# Patient Record
Sex: Male | Born: 2000 | Race: White | Hispanic: No | Marital: Single | Smoking: Current every day smoker
Health system: Southern US, Community
[De-identification: ages and names within clinical notes are randomized; demographics above are authoritative.]

---
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
## 2018-05-15 IMAGING — CR DG CHEST 1V PORT
1 series · 1 of 1 positions shown · non-contrast
Comparison: CT scan of the chest of yesterday.

CLINICAL DATA: Persistent 10% or less left apical pneumothorax.
Left basilar atelectasis or contusion with small left pleural
effusion.

EXAM:
PORTABLE CHEST 1 VIEW

[AP]
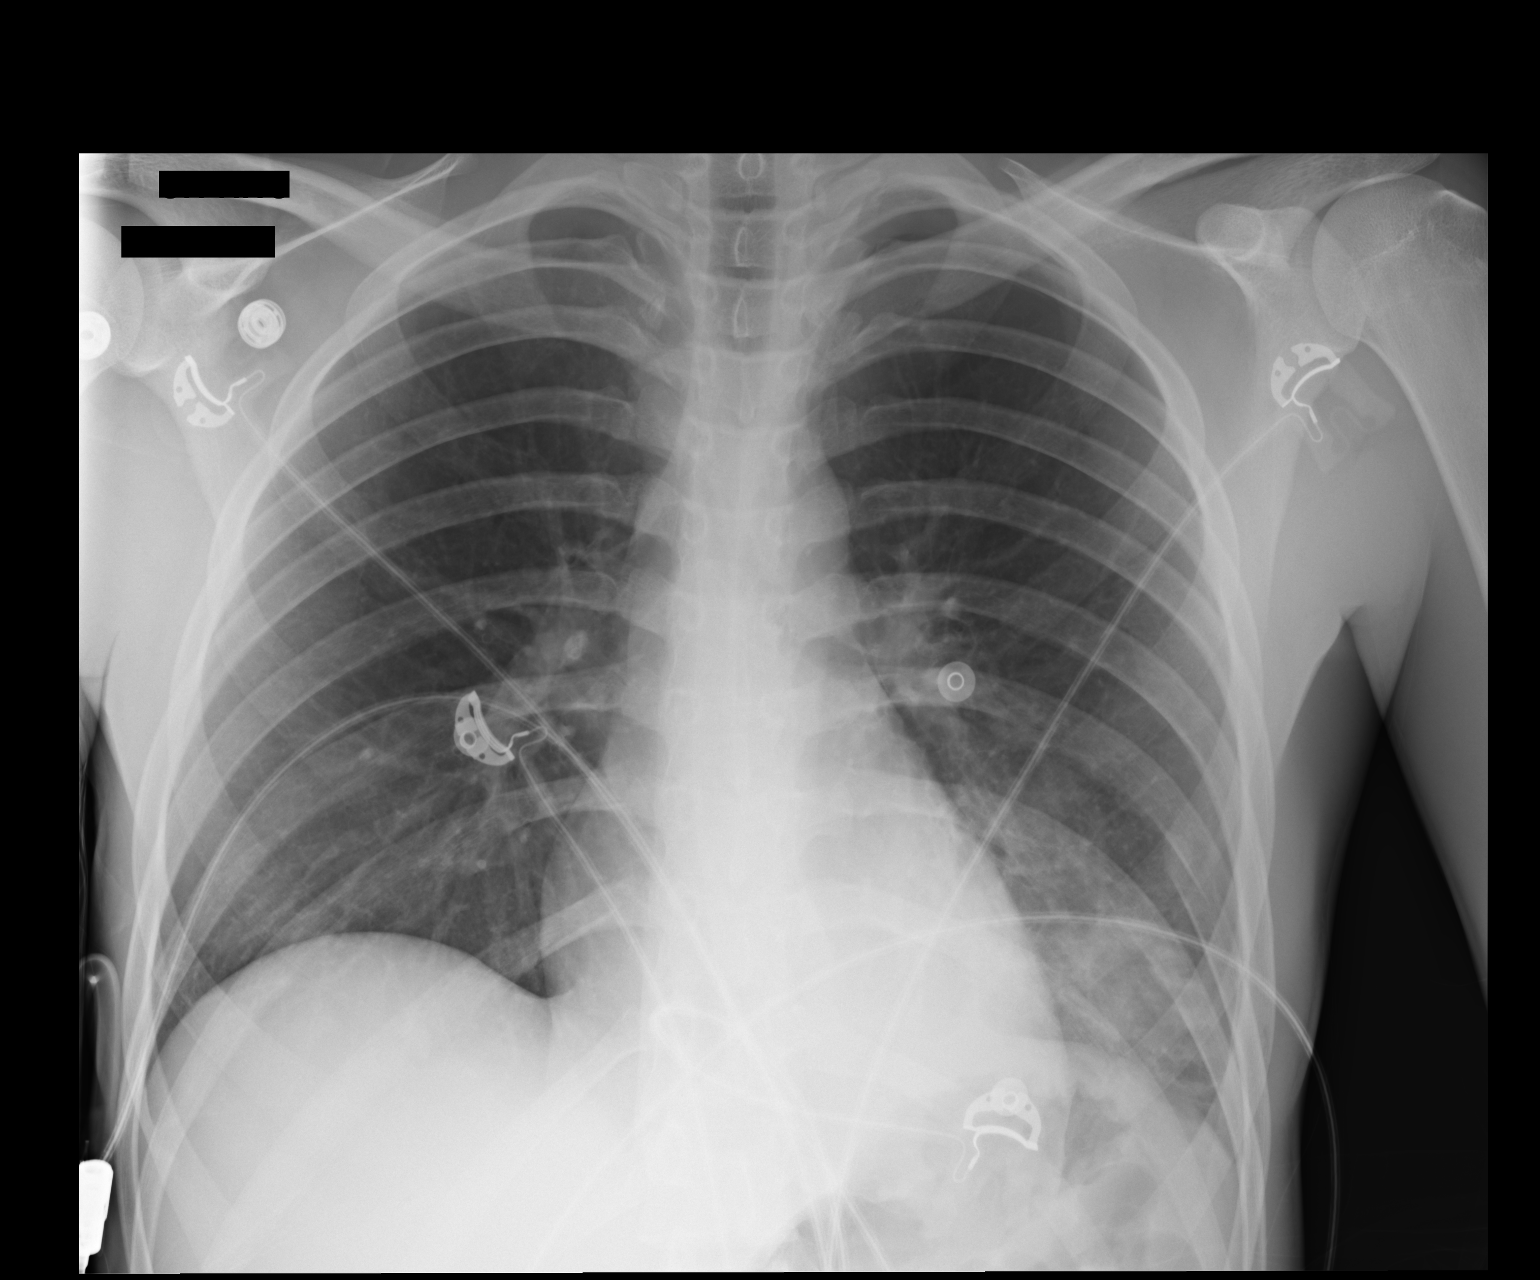

[1 of 1 positions shown; findings below may reference images not displayed]

FINDINGS: There is a persistent approximately 10% left apical pneumothorax.
There is persistently increased density at the left lung base
consistent with atelectasis or pulmonary contusion. A small left
pleural effusion is suspected. The right lung is clear. The heart
and pulmonary vascularity are normal. The mediastinum is normal in
width. There is no mediastinal shift. The known displaced left
lateral eighth rib fracture is again demonstrated.
IMPRESSION: 10% or less left apical pneumothorax. Small left pleural effusion
and left lower lobe atelectasis or contusion. No mediastinal shift.
Known displaced left lateral eighth rib fracture.

## 2019-06-14 ENCOUNTER — Other Ambulatory Visit: Payer: Self-pay

## 2019-06-14 ENCOUNTER — Emergency Department
Admission: EM | Admit: 2019-06-14 | Discharge: 2019-06-14 | Disposition: A | Discharge status: Home / Self Care | Payer: Self-pay | Attending: Emergency Medicine | Admitting: Emergency Medicine

## 2019-06-14 DIAGNOSIS — F17210 Nicotine dependence, cigarettes, uncomplicated: Secondary | ICD-10-CM | POA: Insufficient documentation

## 2019-06-14 DIAGNOSIS — R195 Other fecal abnormalities: Secondary | ICD-10-CM | POA: Insufficient documentation

## 2019-06-14 MED ORDER — PRAZIQUANTEL 600 MG PO TABS: 600.0000 mg | ORAL_TABLET | Freq: Once | ORAL | 0 refills | Status: DC

## 2019-06-14 MED ORDER — PRAZIQUANTEL 600 MG PO TABS: 600.0000 mg | ORAL_TABLET | Freq: Once | ORAL | 0 refills | Status: AC
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
## 2019-06-14 NOTE — ED Notes (Signed)
See triage note; pt has photo of what he believes could be a tapeworm. Pt states his BM this morning was "a little" loose, nothing too far out of ordinary. NAD noted.

## 2019-06-14 NOTE — ED Notes (Signed)
Pt discharged home after verbalizing understanding of discharge instructions; nad noted. 

## 2019-06-14 NOTE — ED Triage Notes (Signed)
Pt states that he got out of jail yesterday and states this am when he bowl movement he saw what appeared to be a tapeworm

## 2021-06-20 ENCOUNTER — Emergency Department
Admission: EM | Admit: 2021-06-20 | Discharge: 2021-06-20 | Disposition: A | Discharge status: Home / Self Care | Payer: Self-pay | Attending: Emergency Medicine | Admitting: Emergency Medicine

## 2021-06-20 ENCOUNTER — Emergency Department: Payer: Self-pay

## 2021-06-20 DIAGNOSIS — F1910 Other psychoactive substance abuse, uncomplicated: Secondary | ICD-10-CM | POA: Insufficient documentation

## 2021-06-20 DIAGNOSIS — R1031 Right lower quadrant pain: Secondary | ICD-10-CM | POA: Insufficient documentation

## 2021-06-20 DIAGNOSIS — R110 Nausea: Secondary | ICD-10-CM | POA: Insufficient documentation

## 2021-06-20 DIAGNOSIS — R000 Tachycardia, unspecified: Secondary | ICD-10-CM | POA: Insufficient documentation

## 2021-06-20 LAB — CBC
HCT: 46.6 % (ref 39.0–52.0)
Hemoglobin: 15.7 g/dL (ref 13.0–17.0)
MCH: 29 pg (ref 26.0–34.0)
MCHC: 33.7 g/dL (ref 30.0–36.0)
MCV: 86 fL (ref 80.0–100.0)
Platelets: 329 10*3/uL (ref 150–400)
RBC: 5.42 MIL/uL (ref 4.22–5.81)
RDW: 13.2 % (ref 11.5–15.5)
WBC: 10 10*3/uL (ref 4.0–10.5)
nRBC: 0 % (ref 0.0–0.2)

## 2021-06-20 LAB — COMPREHENSIVE METABOLIC PANEL
ALT: 36 U/L (ref 0–44)
AST: 21 U/L (ref 15–41)
Albumin: 4.9 g/dL (ref 3.5–5.0)
Alkaline Phosphatase: 59 U/L (ref 38–126)
Anion gap: 7 (ref 5–15)
BUN: 23 mg/dL — ABNORMAL HIGH (ref 6–20)
CO2: 26 mmol/L (ref 22–32)
Calcium: 9.6 mg/dL (ref 8.9–10.3)
Chloride: 103 mmol/L (ref 98–111)
Creatinine, Ser: 0.87 mg/dL (ref 0.61–1.24)
GFR, Estimated: >60 mL/min (ref >60)
Glucose, Bld: 93 mg/dL (ref 70–99)
Potassium: 4 mmol/L (ref 3.5–5.1)
Sodium: 136 mmol/L (ref 135–145)
Total Bilirubin: 1.4 mg/dL — ABNORMAL HIGH (ref 0.3–1.2)
Total Protein: 8.1 g/dL (ref 6.5–8.1)

## 2021-06-20 LAB — URINALYSIS, ROUTINE W REFLEX MICROSCOPIC
Bilirubin Urine: NEGATIVE
Glucose, UA: NEGATIVE mg/dL
Hgb urine dipstick: NEGATIVE
Ketones, ur: 80 mg/dL — AB
Leukocytes,Ua: NEGATIVE
Nitrite: NEGATIVE
Protein, ur: NEGATIVE mg/dL
Specific Gravity, Urine: 1.035 — ABNORMAL HIGH (ref 1.005–1.030)
pH: 6 (ref 5.0–8.0)

## 2021-06-20 LAB — LIPASE, BLOOD: Lipase: 23 U/L (ref 11–51)

## 2021-06-20 MED ORDER — SODIUM CHLORIDE 0.9 % IV BOLUS
1000.0000 mL | Freq: Once | INTRAVENOUS | Status: AC
  Administered 2021-06-20: 1000 mL via INTRAVENOUS

## 2021-06-20 MED ORDER — IOHEXOL 350 MG/ML SOLN
100.0000 mL | Freq: Once | INTRAVENOUS | Status: AC | PRN
Start: 2021-06-20 — End: 2021-06-20
  Administered 2021-06-20: 100 mL via INTRAVENOUS

## 2021-06-20 MED ORDER — IOHEXOL 300 MG/ML  SOLN
100.0000 mL | Freq: Once | INTRAMUSCULAR | Status: DC | PRN
Start: 2021-06-20 — End: 2021-06-20

## 2021-06-20 NOTE — ED Notes (Signed)
Pt endorsing mid line lower abdominal pain that "feels weird and gurgled when I drink water"

## 2021-06-20 NOTE — ED Triage Notes (Signed)
Pt comes ems from home with right upper abd pain. EMS states non-pulsating mass in his right abd area. Denies n/v/d. States his stomach was rumbling this morning and he ate some [employer] and it started hurting. HR 114.

## 2021-06-20 NOTE — Discharge Instructions (Addendum)
Your work-up was reassuring you can take Tylenol 1 g every 8 hours to help with any pain and return to the ER if you develop worsening symptoms fevers or any other concerns  Is important that you try to stop using drugs as this can also cause abdominal discomfort

## 2021-06-20 NOTE — ED Provider Notes (Signed)
[facility] Provider Note    Event Date/Time   First MD Initiated Contact with Patient 06/20/21 1042     (approximate)   History   Abdominal Pain   HPI  Tim Sparks is a 21 y.o. male who comes in with abdominal pain.  Patient does report smoking meth yesterday but he reports that today he has developed some pain in his right lower abdomen contrary to triage note it is not his upper abdomen.  He denies any history of surgeries before.  Denies any vomiting or diarrhea.  Reports just having the pain.  Denies any other urinary symptoms or other concerns denies any daily alcohol use.  He denies any chest pain, shortness of breath or other concerns.   Physical Exam   Triage Vital Signs: ED Triage Vitals  Enc Vitals Group     BP 06/20/21 1035 (!) 165/148     Pulse Rate 06/20/21 1035 (!) 108     Resp 06/20/21 1035 18     Temp 06/20/21 1035 98.2 F (36.8 C)     Temp Source 06/20/21 1035 Oral     SpO2 06/20/21 1035 95 %     Weight --      Height --      Head Circumference --      Peak Flow --      Pain Score 06/20/21 1032 0     Pain Loc --      Pain Edu? --      Excl. in [place]? --     Most recent vital signs: Vitals:   06/20/21 1035  BP: (!) 165/148  Pulse: (!) 108  Resp: 18  Temp: 98.2 F (36.8 C)  SpO2: 95%     General: Awake, no distress.  CV:  Good peripheral perfusion.  Resp:  Normal effort.  Abd:  No distention. Tender in the RLQ  Other:     ED Results / Procedures / Treatments   Labs (all labs ordered are listed, but only abnormal results are displayed) Labs Reviewed  LIPASE, BLOOD  COMPREHENSIVE METABOLIC PANEL  CBC  URINALYSIS, ROUTINE W REFLEX MICROSCOPIC     EKG  My interpretation of EKG:  Sinus rhythm rate of 98 without any ST elevation or T wave inversions, normal intervals  RADIOLOGY I have reviewed the CT personally interpreted and do not see any appendicitis or kidney stone  PROCEDURES:  Critical Care  performed: No  .1-3 Lead EKG Interpretation  Performed by: Vanessa [place], MD Authorized by: Vanessa [place], MD     Interpretation: normal     ECG rate:  90   ECG rate assessment: normal     Rhythm: sinus rhythm     Ectopy: none     Conduction: normal   Comments:     Initially tachycardic but normal sinus after fluids    MEDICATIONS ORDERED IN ED: Medications  sodium chloride 0.9 % bolus 1,000 mL (1,000 mLs Intravenous New Bag/Given 06/20/21 1150)  sodium chloride 0.9 % bolus 1,000 mL (1,000 mLs Intravenous New Bag/Given 06/20/21 1150)  iohexol (OMNIPAQUE) 350 MG/ML injection 100 mL (100 mLs Intravenous Contrast Given 06/20/21 1130)     IMPRESSION / MDM / ASSESSMENT AND PLAN / ED COURSE  I reviewed the triage vital signs and the nursing notes.   Patient's presentation is most consistent with acute presentation with potential threat to life or bodily function.   Differential includes appendicitis, diverticulitis, kidney stone.  Given he is  tender in his right lower quadrant will get CT imaging.  Lower suspicion for gallstones given his tenderness is not as much in the right upper quadrant.  Patient was given some IV fluids for his tachycardia and IV Zofran to help with any nausea  Lipase is normal.  CMP shows slightly elevated T. bili but normal LFTs.  CBC is normal.  CT imaging is negative.  Patient requesting discharge home stating that his ride is on the way..  Urine is still pending.  Will discharge patient with using Tylenol, ibuprofen for pain and we discussed cessation of drugs  The patient is on the cardiac monitor to evaluate for evidence of arrhythmia and/or significant heart rate changes.      FINAL CLINICAL IMPRESSION(S) / ED DIAGNOSES   Final diagnoses:  Drug abuse ([place])  Right lower quadrant abdominal pain     Rx / DC Orders   ED Discharge Orders     None        Note:  This document was prepared using Dragon voice recognition software and may  include unintentional dictation errors.   Vanessa Hiller, MD 06/20/21 1226

## 2021-06-20 NOTE — ED Notes (Signed)
DC ppw provided, pt declines questions at this time. Pt alert and oriented to lobby on foot after providing verbal consent for DC.

## 2021-06-20 NOTE — ED Triage Notes (Signed)
Pt states he is also high on meth-last use 1am

## 2021-06-23 DIAGNOSIS — E876 Hypokalemia: Secondary | ICD-10-CM | POA: Insufficient documentation

## 2021-06-23 DIAGNOSIS — F1510 Other stimulant abuse, uncomplicated: Secondary | ICD-10-CM | POA: Insufficient documentation

## 2021-06-23 DIAGNOSIS — D72829 Elevated white blood cell count, unspecified: Secondary | ICD-10-CM | POA: Insufficient documentation

## 2021-06-23 DIAGNOSIS — Z20822 Contact with and (suspected) exposure to covid-19: Secondary | ICD-10-CM | POA: Insufficient documentation

## 2021-06-23 DIAGNOSIS — Y900 Blood alcohol level of less than 20 mg/100 ml: Secondary | ICD-10-CM | POA: Insufficient documentation

## 2021-06-23 LAB — COMPREHENSIVE METABOLIC PANEL
ALT: 20 U/L (ref 0–44)
AST: 17 U/L (ref 15–41)
Albumin: 4.7 g/dL (ref 3.5–5.0)
Alkaline Phosphatase: 55 U/L (ref 38–126)
Anion gap: 7 (ref 5–15)
BUN: 9 mg/dL (ref 6–20)
CO2: 27 mmol/L (ref 22–32)
Calcium: 9.4 mg/dL (ref 8.9–10.3)
Chloride: 105 mmol/L (ref 98–111)
Creatinine, Ser: 0.84 mg/dL (ref 0.61–1.24)
GFR, Estimated: >60 mL/min (ref >60)
Glucose, Bld: 95 mg/dL (ref 70–99)
Potassium: 3.2 mmol/L — ABNORMAL LOW (ref 3.5–5.1)
Sodium: 139 mmol/L (ref 135–145)
Total Bilirubin: 1.1 mg/dL (ref 0.3–1.2)
Total Protein: 7.8 g/dL (ref 6.5–8.1)

## 2021-06-23 LAB — CBC
HCT: 46.3 % (ref 39.0–52.0)
Hemoglobin: 15.5 g/dL (ref 13.0–17.0)
MCH: 28.6 pg (ref 26.0–34.0)
MCHC: 33.5 g/dL (ref 30.0–36.0)
MCV: 85.4 fL (ref 80.0–100.0)
Platelets: 348 10*3/uL (ref 150–400)
RBC: 5.42 MIL/uL (ref 4.22–5.81)
RDW: 12.9 % (ref 11.5–15.5)
WBC: 12.6 10*3/uL — ABNORMAL HIGH (ref 4.0–10.5)
nRBC: 0 % (ref 0.0–0.2)

## 2021-06-23 LAB — ETHANOL: Alcohol, Ethyl (B): <10 mg/dL (ref <10)

## 2021-06-23 LAB — ACETAMINOPHEN LEVEL: Acetaminophen (Tylenol), Serum: <10 ug/mL — ABNORMAL LOW (ref 10–30)

## 2021-06-23 LAB — SALICYLATE LEVEL: Salicylate Lvl: <7 mg/dL — ABNORMAL LOW (ref 7.0–30.0)

## 2021-06-23 NOTE — ED Triage Notes (Signed)
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 Notes using earlier today and he smokes meth. Denies SI/HI.

## 2021-06-24 ENCOUNTER — Emergency Department
Admission: EM | Admit: 2021-06-24 | Discharge: 2021-06-24 | Disposition: A | Discharge status: Home / Self Care | Payer: Self-pay | Attending: Emergency Medicine | Admitting: Emergency Medicine

## 2021-06-24 DIAGNOSIS — F1510 Other stimulant abuse, uncomplicated: Secondary | ICD-10-CM

## 2021-06-24 LAB — RESP PANEL BY RT-PCR (FLU A&B, COVID) ARPGX2
Influenza A by PCR: NEGATIVE
Influenza B by PCR: NEGATIVE
SARS Coronavirus 2 by RT PCR: NEGATIVE

## 2021-06-24 MED ORDER — POTASSIUM CHLORIDE CRYS ER 20 MEQ PO TBCR
40.0000 meq | EXTENDED_RELEASE_TABLET | Freq: Once | ORAL | Status: AC
  Administered 2021-06-24: 40 meq via ORAL
  Filled 2021-06-24: qty 2

## 2021-06-24 NOTE — BH Assessment (Signed)
Comprehensive Clinical Assessment (CCA) Note  06/24/2021 Tim Sparks [id number]  Chief Complaint: Patient is a 21 year old male presenting to [facility] ED voluntarily requesting detox treatment. Per triage note Pt presents via POV for psych eval and detox from meth. Notes using earlier today and he smokes meth. Denies SI/HI. During assessment patient appears alert and oriented x4, calm and cooperative. Patient reports using Methamphetamines via smoking. Patient reports smoking "1 gram" daily and reports that he first started using "2 years ago." Patient denies every being admitted to detox before in the past, he's unemployed and currently living with family. 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 Self  Referral name: No data recorded Referral phone number: No data recorded  Whom do you see for routine medical problems? No data recorded Practice/Facility Name: No data recorded Practice/Facility Phone Number: No data recorded Name of Contact: No data recorded Contact Number: No data recorded Contact Fax Number: No data recorded Prescriber Name: No data recorded Prescriber Address (if known): No data recorded  What Is the Reason for Your Visit/Call Today? Patient presents requesting detox treatment  How Long Has This Been Causing You Problems? > than 6 months  What Do You Feel Would Help You the Most Today? Alcohol or Drug Use Treatment   Have You Recently Been in Any Inpatient Treatment (Hospital/Detox/Crisis Center/28-Day Program)? No data recorded Name/Location of Program/Hospital:No data recorded How Long Were You There? No data recorded When Were You Discharged? No data recorded  Have You Ever Received Services From [facility] Before? No data recorded Who Do You See at [facility]? No data recorded  Have You Recently Had Any Thoughts About Hurting Yourself? No  Are You Planning to Commit Suicide/Harm Yourself At This time? No   Have you Recently Had Thoughts About Hurting Someone [name]? No  Explanation: No data recorded  Have You Used Any Alcohol or Drugs in the Past 24 Hours? Yes  How Long Ago Did You Use Drugs or Alcohol? No data recorded What Did You Use and How Much? Methamphetamines   Do You Currently Have a Therapist/Psychiatrist? No  Name of Therapist/Psychiatrist: No data recorded  Have You Been Recently Discharged From Any Office Practice or Programs? No  Explanation of Discharge From Practice/Program: No data recorded    CCA Screening Triage Referral Assessment Type of Contact: Face-to-Face  Is this Initial or Reassessment? No data recorded Date Telepsych consult ordered in [facility]:  No data recorded Time Telepsych consult ordered in [facility]:  No data recorded  Patient Reported Information Reviewed? No data recorded Patient Left Without Being Seen? No data recorded Reason for Not Completing Assessment: No data recorded  Collateral Involvement: No data recorded  Does Patient Have a Court Appointed Legal Guardian? No data recorded Name and Contact of Legal Guardian: No data recorded If Minor and Not Living with Parent(s), Who has Custody? No data recorded Is CPS involved or ever been involved? Never  Is APS involved or ever been involved? Never   Patient Determined To Be At Risk for Harm To Self or Others Based on Review of Patient Reported Information or Presenting Complaint? No  Method: No data recorded Availability of Means: No data recorded Intent: No data recorded Notification Required: No data recorded Additional Information for Danger to Others Potential: No  data recorded Additional Comments for Danger to Others Potential: No data recorded Are There Guns or Other Weapons in Your Home? No data recorded Types of Guns/Weapons:  No data recorded Are These Weapons Safely Secured?                            No data recorded Who Could Verify You Are Able To Have These Secured: No data recorded Do You Have any Outstanding Charges, Pending Court Dates, Parole/Probation? No data recorded Contacted To Inform of Risk of Harm To Self or Others: No data recorded  Location of Assessment: [facility] ED   Does Patient Present under Involuntary Commitment? No  IVC Papers Initial File Date: No data recorded  [place] of Residence: [place]   Patient Currently Receiving the Following Services: No data recorded  Determination of Need: Emergent (2 hours)   Options For Referral: No data recorded    CCA Biopsychosocial Intake/Chief Complaint:  No data recorded Current Symptoms/Problems: No data recorded  Patient Reported Schizophrenia/Schizoaffective Diagnosis in Past: No   Strengths: Patient is able to communicate his needs  Preferences: No data recorded Abilities: No data recorded  Type of Services Patient Feels are Needed: No data recorded  Initial Clinical Notes/Concerns: No data recorded  Mental Health Symptoms Depression:   Change in energy/activity; Hopelessness   Duration of Depressive symptoms:  Greater than two weeks   Mania:   None   Anxiety:    None   Psychosis:   None   Duration of Psychotic symptoms: No data recorded  Trauma:   None   Obsessions:   None   Compulsions:   None   Inattention:   None   Hyperactivity/Impulsivity:   None   Oppositional/Defiant Behaviors:   None   Emotional Irregularity:   None   Other Mood/Personality Symptoms:  No data recorded   Mental Status Exam Appearance and self-care  Stature:   Average   Weight:   Average weight   Clothing:   Casual   Grooming:   Normal   Cosmetic use:   None   Posture/gait:   Normal   Motor activity:   Not Remarkable   Sensorium  Attention:   Normal   Concentration:   Normal   Orientation:    X5   Recall/memory:   Normal   Affect and Mood  Affect:   Appropriate   Mood:   Depressed   Relating  Eye contact:   Normal   Facial expression:   Responsive   Attitude toward examiner:   Cooperative   Thought and Language  Speech flow:  Clear and Coherent   Thought content:   Appropriate to Mood and Circumstances   Preoccupation:   None   Hallucinations:   None   Organization:  No data recorded  [employer] of Knowledge:   Fair   Intelligence:   Average   Abstraction:   Normal   Judgement:   Fair   Dance movement psychotherapist:   Realistic   Insight:   Fair   Decision Making:   Normal   Social Functioning  Social Maturity:   Responsible   Social Judgement:   Normal   Stress  Stressors:   Office manager Ability:   Exhausted   Skill Deficits:   None   Supports:   Family     Religion: Religion/Spirituality Are You A Religious Person?: No  Leisure/Recreation: Leisure / Recreation Do You Have Hobbies?:  No  Exercise/Diet: Exercise/Diet Do You Exercise?: No Have You Gained or Lost A Significant Amount of Weight in the Past Six Months?: No Do You Follow a Special Diet?: No Do You Have Any Trouble Sleeping?: No   CCA Employment/Education Employment/Work Situation: Employment / Work Situation Employment Situation: Unemployed Patient's Job has Been Impacted by Current Illness: No Has Patient ever Been in Equities trader?: No  Education: Education Is Patient Currently Attending School?: No Did You Have An Individualized Education Program (IIEP): No Did You Have Any Difficulty At [employer]?: No Patient's Education Has Been Impacted by Current Illness: No   CCA Family/Childhood History Family and Relationship History: Family history Marital status: Single Does patient have children?: No  Childhood History:  Childhood History Did patient suffer any verbal/emotional/physical/sexual abuse as a child?: No Did  patient suffer from severe childhood neglect?: No Has patient ever been sexually abused/assaulted/raped as an adolescent or adult?: No Was the patient ever a victim of a crime or a disaster?: No Witnessed domestic violence?: No  Child/Adolescent Assessment:     CCA Substance Use Alcohol/Drug Use: Alcohol / Drug Use Pain Medications: See MAR Prescriptions: See MAR Over the Counter: See MAR History of alcohol / drug use?: Yes Substance #1 Name of Substance 1: Methamphetamines 1 - Age of First Use: 19 1 - Amount (size/oz): "1 gram" 1 - Frequency: daily 1 - Last Use / Amount: 06/23/21 1- Route of Use: Smoking                       ASAM's:  Six Dimensions of Multidimensional Assessment  Dimension 1:  Acute Intoxication and/or Withdrawal Potential:      Dimension 2:  Biomedical Conditions and Complications:      Dimension 3:  Emotional, Behavioral, or Cognitive Conditions and Complications:     Dimension 4:  Readiness to Change:     Dimension 5:  Relapse, Continued use, or Continued Problem Potential:     Dimension 6:  Recovery/Living Environment:     ASAM Severity Score:    ASAM Recommended Level of Treatment: ASAM Recommended Level of Treatment: Level III Residential Treatment   Substance use Disorder (SUD) Substance Use Disorder (SUD)  Checklist Symptoms of Substance Use: Continued use despite having a persistent/recurrent physical/psychological problem caused/exacerbated by use, Evidence of tolerance, Presence of craving or strong urge to use, Social, occupational, recreational activities given up or reduced due to use, Continued use despite persistent or recurrent social, interpersonal problems, caused or exacerbated by use, Persistent desire or unsuccessful efforts to cut down or control use, Recurrent use that results in a failure to fulfill major role obligations (work, school, home)  Recommendations for Services/Supports/Treatments: Recommendations for  Services/Supports/Treatments Recommendations For Services/Supports/Treatments: Detox  DSM5 Diagnoses: Patient Active Problem List   Diagnosis Date Noted   Pneumothorax 03/08/2016    Patient Centered Plan: Patient is on the following Treatment Plan(s):  Substance Abuse   Referrals to Alternative Service(s): Referred to Alternative Service(s):   Place:   Date:   Time:    Referred to Alternative Service(s):   Place:   Date:   Time:    Referred to Alternative Service(s):   Place:   Date:   Time:    Referred to Alternative Service(s):   Place:   Date:   Time:      @BHCOLLABOFCARE @  , LCAS-A

## 2021-06-24 NOTE — BH Assessment (Addendum)
Referral information for detox treatment faxed to;   [facility] [phone]) (Fax#-[phone]-[phone]) Staff reports male beds are available, patient will have to be reviewed first

## 2021-06-24 NOTE — Discharge Instructions (Addendum)
Steps to find a Primary Care Provider (PCP):  Call [phone] or [phone] to access "[place] Find a Doctor Service."  2.  You may also go on the [place] website at [email or web address]  

## 2021-06-24 NOTE — ED Notes (Signed)
Safe  transport  called  to  [facility]  [place]  Jette

## 2021-06-24 NOTE — ED Provider Notes (Signed)
[facility] Provider Note    Event Date/Time   First MD Initiated Contact with Patient 06/24/21 0106     (approximate)   History   detox   HPI  Tim Sparks is a 21 y.o. male with history of methamphetamine abuse who presents to the emergency department requesting detox from methamphetamine.  He denies SI, HI.  States he will have hallucinations when doing methamphetamine.  Denies any medical complaints.   History provided by patient.    No past medical history on file.  No past surgical history on file.  MEDICATIONS:  Prior to Admission medications   Medication Sig Start Date End Date Taking? Authorizing Provider  oxyCODONE (OXY IR/ROXICODONE) 5 MG immediate release tablet Take 1 tablet (5 mg total) by mouth every 4 (four) hours as needed for moderate pain. 03/09/16   Jerre Simon, PA    Physical Exam   Triage Vital Signs: ED Triage Vitals  Enc Vitals Group     BP 06/23/21 2251 (!) 144/92     Pulse Rate 06/23/21 2251 99     Resp 06/23/21 2251 20     Temp 06/23/21 2251 98.2 F (36.8 C)     Temp Source 06/23/21 2251 Oral     SpO2 06/23/21 2251 100 %     Weight 06/23/21 2252 143 lb 4.8 oz (65 kg)     Height 06/23/21 2252 5\' 7"  (1.702 m)     Head Circumference --      Peak Flow --      Pain Score 06/23/21 2252 0     Pain Loc --      Pain Edu? --      Excl. in [place]? --     Most recent vital signs: Vitals:   06/23/21 2251  BP: (!) 144/92  Pulse: 99  Resp: 20  Temp: 98.2 F (36.8 C)  SpO2: 100%    CONSTITUTIONAL: Alert and oriented and responds appropriately to questions. Well-appearing; well-nourished HEAD: Normocephalic, atraumatic EYES: Conjunctivae clear, pupils appear equal, sclera nonicteric ENT: normal nose; moist mucous membranes NECK: Supple, normal ROM CARD: RRR; S1 and S2 appreciated; no murmurs, no clicks, no rubs, no gallops RESP: Normal chest excursion without splinting or tachypnea; breath sounds clear and  equal bilaterally; no wheezes, no rhonchi, no rales, no hypoxia or respiratory distress, speaking full sentences ABD/GI: Normal bowel sounds; non-distended; soft, non-tender, no rebound, no guarding, no peritoneal signs BACK: The back appears normal EXT: Normal ROM in all joints; no deformity noted, no edema; no cyanosis SKIN: Normal color for age and race; warm; no rash on exposed skin NEURO: Moves all extremities equally, normal speech PSYCH: The patient's mood and manner are appropriate.  No SI, HI.  Not responding to internal stimuli.  Calm and cooperative.   ED Results / Procedures / Treatments   LABS: (all labs ordered are listed, but only abnormal results are displayed) Labs Reviewed  COMPREHENSIVE METABOLIC PANEL - Abnormal; Notable for the following components:      Result Value   Potassium 3.2 (*)    All other components within normal limits  SALICYLATE LEVEL - Abnormal; Notable for the following components:   Salicylate Lvl <7.0 (*)    All other components within normal limits  ACETAMINOPHEN LEVEL - Abnormal; Notable for the following components:   Acetaminophen (Tylenol), Serum <10 (*)    All other components within normal limits  CBC - Abnormal; Notable for the following components:   WBC  12.6 (*)    All other components within normal limits  ETHANOL  URINE DRUG SCREEN, QUALITATIVE ([facility] ONLY)     EKG:  RADIOLOGY: My personal review and interpretation of imaging:    I have personally reviewed all radiology reports.   No results found.   PROCEDURES:  Critical Care performed: No     Procedures    IMPRESSION / MDM / ASSESSMENT AND PLAN / ED COURSE  I reviewed the triage vital signs and the nursing notes.    Patient here requesting rehab/detox from methamphetamine.  Resting comfortably.  Does not appear acutely intoxicated.  No SI or HI.    DIFFERENTIAL DIAGNOSIS (includes but not limited to):   Methamphetamine abuse, substance abuse,  intoxication   Patient's presentation is most consistent with acute presentation with potential threat to life or bodily function.   PLAN: Patient here requesting rehab/detox for methamphetamine.  Does not appear acutely intoxicated or having any withdrawal symptoms.  No psychiatric safety concerns.  We will consult TTS for further recommendations.   MEDICATIONS GIVEN IN ED: Medications  potassium chloride SA (KLOR-CON M) CR tablet 40 mEq (has no administration in time range)     ED COURSE: Labs obtained from triage reviewed.  Patient has a slight leukocytosis but no infectious symptoms.  Normal hemoglobin.  Normal electrolytes and renal function other than potassium level 3.2.  Will hold oral replacement.  Normal LFTs.  Negative Tylenol, salicylate and alcohol levels.  Drug screen pending.   I reviewed all nursing notes and pertinent previous records as available.  I have reviewed and interpreted any and all EKGs, lab and urine results, imaging and radiology reports (as available).   CONSULTS: TTS consulted for further recommendations, disposition.   OUTSIDE RECORDS REVIEWED: Reviewed patient previous admission in December 2018.       FINAL CLINICAL IMPRESSION(S) / ED DIAGNOSES   Final diagnoses:  Methamphetamine abuse (HCC)     Rx / DC Orders   ED Discharge Orders     None        Note:  This document was prepared using Dragon voice recognition software and may include unintentional dictation errors.   Ahnaf Caponi, Layla Maw, DO 06/24/21 [phone]

## 2021-06-24 NOTE — ED Notes (Addendum)
TTS @ the bedside. Pt reminded about the need for urine specimen.

## 2021-06-24 NOTE — BH Assessment (Signed)
Spoke with [facility] [phone]), they are able to take the patient. They will hold the bed for eight hours. Informed patient's nurse Jonny Ruiz) and ER sect. (Anette W.).  Address: [address],  [place], [place] [address]

## 2021-06-24 NOTE — ED Notes (Signed)
Pt left with [employer].   

## 2021-10-04 ENCOUNTER — Other Ambulatory Visit: Payer: Self-pay

## 2021-10-04 DIAGNOSIS — W1840XA Slipping, tripping and stumbling without falling, unspecified, initial encounter: Secondary | ICD-10-CM | POA: Insufficient documentation

## 2021-10-04 DIAGNOSIS — S01511A Laceration without foreign body of lip, initial encounter: Secondary | ICD-10-CM | POA: Diagnosis not present

## 2021-10-04 DIAGNOSIS — Y92149 Unspecified place in prison as the place of occurrence of the external cause: Secondary | ICD-10-CM | POA: Insufficient documentation

## 2021-10-04 NOTE — ED Triage Notes (Signed)
Pt unable to sign MSE waiver.  

## 2021-10-04 NOTE — ED Triage Notes (Signed)
Pt presents via police custody from jail with laceration to lip. Denies pain outside of lip. Reports "tripped"

## 2021-10-05 ENCOUNTER — Emergency Department
Admission: EM | Admit: 2021-10-05 | Discharge: 2021-10-05 | Disposition: A | Discharge status: Home / Self Care | Attending: Emergency Medicine | Admitting: Emergency Medicine

## 2021-10-05 DIAGNOSIS — S01511A Laceration without foreign body of lip, initial encounter: Secondary | ICD-10-CM

## 2021-10-05 MED ORDER — LIDOCAINE HCL (PF) 1 % IJ SOLN
5.0000 mL | Freq: Once | INTRAMUSCULAR | Status: AC
  Administered 2021-10-05: 5 mL
  Filled 2021-10-05: qty 5

## 2021-10-05 MED ORDER — IBUPROFEN 400 MG PO TABS
600.0000 mg | ORAL_TABLET | Freq: Once | ORAL | Status: AC
Start: 2021-10-05 — End: 2021-10-05
  Administered 2021-10-05: 600 mg via ORAL
  Filled 2021-10-05: qty 2

## 2021-10-05 MED ORDER — ACETAMINOPHEN 500 MG PO TABS
1000.0000 mg | ORAL_TABLET | Freq: Once | ORAL | Status: AC
  Administered 2021-10-05: 1000 mg via ORAL
  Filled 2021-10-05: qty 2

## 2021-10-05 NOTE — Discharge Instructions (Signed)
Please keep your wound clean by washing at least daily with soap and water. If you see any signs of infection like spreading redness, pus coming from the wound, extreme pain, fevers, chills or any other worsening doctor right away or come back to the emergency department Take acetaminophen 650 mg and ibuprofen 400 mg every 6 hours for pain.  Take with food. Suture will dissolve within 1 week. If they are still there in 1 week see a doctor to remove them.   Thank you for choosing [place] for your health care today!  Please see your primary doctor this week for a follow up appointment.   If you do not have a primary doctor call the following clinics to establish care:  If you have insurance:  [facility] [phone] [place] [address]   [facility]  [phone] [place] [place] [address]   If you do not have insurance:  [facility]  [phone] [address], [place] [address]  Sometimes, in the early stages of certain disease courses it is difficult to detect in the emergency department evaluation -- so, it is important that you continue to monitor your symptoms and call your doctor right away or return to the emergency department if you develop any new or worsening symptoms.  It was my pleasure to care for you today.   Hoover Brunette Jacelyn Grip, MD

## 2021-10-05 NOTE — ED Provider Notes (Signed)
[facility] Provider Note    None    (approximate)   History   Laceration   HPI  Tim Sparks is a 21 y.o. male   Past medical history of no pmh here with lower lip laceration tripped hit lip on sink, no other injuries, no loc or thinners.   No loose teeth or broken teeth  No other complaints  History was obtained via patient      Physical Exam   Triage Vital Signs: ED Triage Vitals  Enc Vitals Group     BP 10/04/21 2223 (!) 144/86     Pulse Rate 10/04/21 2223 96     Resp 10/04/21 2223 16     Temp 10/04/21 2223 98.5 F (36.9 C)     Temp Source 10/04/21 2223 Oral     SpO2 10/04/21 2223 99 %     Weight 10/04/21 2223 130 lb (59 kg)     Height 10/04/21 2223 5\' 4"  (1.626 m)     Head Circumference --      Peak Flow --      Pain Score 10/04/21 2225 7     Pain Loc --      Pain Edu? --      Excl. in [place]? --     Most recent vital signs: Vitals:   10/04/21 2223 10/05/21 0502  BP: (!) 144/86 124/78  Pulse: 96 78  Resp: 16 16  Temp: 98.5 F (36.9 C) 98.4 F (36.9 C)  SpO2: 99% 99%    General: Awake, no distress.  CV:  Good peripheral perfusion.  Resp:  Normal effort.  Abd:  No distention.  Other:  Gaping 1 cm lac to lower lip center no vermillion violation and no loose teeth or fracture.    ED Results / Procedures / Treatments   Labs (all labs ordered are listed, but only abnormal results are displayed) Labs Reviewed - No data to display    PROCEDURES:  Critical Care performed: No  ..Laceration Repair  Date/Time: 10/05/2021 5:23 AM  Performed by: Lucillie Garfinkel, MD Authorized by: Lucillie Garfinkel, MD   Consent:    Consent obtained:  Verbal   Consent given by:  Patient   Risks, benefits, and alternatives were discussed: yes     Risks discussed:  Infection, poor cosmetic result, pain and poor wound healing   Alternatives discussed:  Delayed treatment and no treatment Universal protocol:    Procedure explained and  questions answered to patient or proxy's satisfaction: yes     Patient identity confirmed:  Verbally with patient Anesthesia:    Anesthesia method:  Local infiltration   Local anesthetic:  Lidocaine 1% w/o epi Laceration details:    Location:  Lip   Lip location:  Lower exterior lip   Length (cm):  1.5 Pre-procedure details:    Preparation:  Patient was prepped and draped in usual sterile fashion Exploration:    Limited defect created (wound extended): no     Hemostasis achieved with:  Direct pressure   Imaging outcome: foreign body not noted     Wound exploration: wound explored through full range of motion     Wound extent: no foreign bodies/material noted     Contaminated: no   Treatment:    Area cleansed with:  Povidone-iodine   Amount of cleaning:  Standard   Irrigation solution:  Tap water   Visualized foreign bodies/material removed: no     Debridement:  None   Undermining:  None   Scar revision: no   Skin repair:    Repair method:  Sutures   Suture size:  5-0   Wound skin closure material used: vicryl rapide.   Suture technique:  Simple interrupted   Number of sutures:  2 Approximation:    Approximation:  Loose   Vermilion border well-aligned: not affected.   Repair type:    Repair type:  Simple Post-procedure details:    Dressing:  Open (no dressing)   Procedure completion:  Tolerated    MEDICATIONS ORDERED IN ED: Medications  acetaminophen (TYLENOL) tablet 1,000 mg (1,000 mg Oral Given 10/05/21 0149)  lidocaine (PF) (XYLOCAINE) 1 % injection 5 mL (5 mLs Other Given by Other 10/05/21 0515)  ibuprofen (ADVIL) tablet 600 mg (600 mg Oral Given 10/05/21 0522)     IMPRESSION / MDM / ASSESSMENT AND PLAN / ED COURSE  I reviewed the triage vital signs and the nursing notes.                              Differential diagnosis includes, but is not limited to, lip laceration no evidence of dental injury or other facial/intracranial injury  Repaired as above. DC in  police custody.    Patient's presentation is most consistent with acute illness / injury with system symptoms.       FINAL CLINICAL IMPRESSION(S) / ED DIAGNOSES   Final diagnoses:  Lip laceration, initial encounter     Rx / DC Orders   ED Discharge Orders     None        Note:  This document was prepared using Dragon voice recognition software and may include unintentional dictation errors.    Pilar Jarvis, MD 10/05/21 [phone]

## 2021-10-05 NOTE — ED Notes (Signed)
ED Provider at bedside. 

## 2022-01-30 ENCOUNTER — Encounter: Payer: Self-pay | Admitting: Emergency Medicine

## 2022-01-30 ENCOUNTER — Other Ambulatory Visit: Payer: Self-pay

## 2022-01-30 DIAGNOSIS — R45851 Suicidal ideations: Secondary | ICD-10-CM | POA: Insufficient documentation

## 2022-01-30 DIAGNOSIS — Z20822 Contact with and (suspected) exposure to covid-19: Secondary | ICD-10-CM | POA: Insufficient documentation

## 2022-01-30 DIAGNOSIS — F29 Unspecified psychosis not due to a substance or known physiological condition: Secondary | ICD-10-CM | POA: Insufficient documentation

## 2022-01-30 LAB — COMPREHENSIVE METABOLIC PANEL
ALT: 37 U/L (ref 0–44)
AST: 60 U/L — ABNORMAL HIGH (ref 15–41)
Albumin: 4.6 g/dL (ref 3.5–5.0)
Alkaline Phosphatase: 68 U/L (ref 38–126)
Anion gap: 11 (ref 5–15)
BUN: 21 mg/dL — ABNORMAL HIGH (ref 6–20)
CO2: 25 mmol/L (ref 22–32)
Calcium: 9.1 mg/dL (ref 8.9–10.3)
Chloride: 102 mmol/L (ref 98–111)
Creatinine, Ser: 1.06 mg/dL (ref 0.61–1.24)
GFR, Estimated: >60 mL/min (ref >60)
Glucose, Bld: 124 mg/dL — ABNORMAL HIGH (ref 70–99)
Potassium: 3.8 mmol/L (ref 3.5–5.1)
Sodium: 138 mmol/L (ref 135–145)
Total Bilirubin: 1 mg/dL (ref 0.3–1.2)
Total Protein: 7.7 g/dL (ref 6.5–8.1)

## 2022-01-30 LAB — RESP PANEL BY RT-PCR (RSV, FLU A&B, COVID)  RVPGX2
Influenza A by PCR: NEGATIVE
Influenza B by PCR: NEGATIVE
Resp Syncytial Virus by PCR: NEGATIVE
SARS Coronavirus 2 by RT PCR: NEGATIVE

## 2022-01-30 LAB — CBC
HCT: 42 % (ref 39.0–52.0)
Hemoglobin: 14.8 g/dL (ref 13.0–17.0)
MCH: 29.3 pg (ref 26.0–34.0)
MCHC: 35.2 g/dL (ref 30.0–36.0)
MCV: 83.2 fL (ref 80.0–100.0)
Platelets: 344 10*3/uL (ref 150–400)
RBC: 5.05 MIL/uL (ref 4.22–5.81)
RDW: 12.4 % (ref 11.5–15.5)
WBC: 18 10*3/uL — ABNORMAL HIGH (ref 4.0–10.5)
nRBC: 0 % (ref 0.0–0.2)

## 2022-01-30 LAB — SALICYLATE LEVEL: Salicylate Lvl: <7 mg/dL — ABNORMAL LOW (ref 7.0–30.0)

## 2022-01-30 LAB — ETHANOL: Alcohol, Ethyl (B): <10 mg/dL (ref <10)

## 2022-01-30 LAB — ACETAMINOPHEN LEVEL: Acetaminophen (Tylenol), Serum: <10 ug/mL — ABNORMAL LOW (ref 10–30)

## 2022-01-30 NOTE — ED Triage Notes (Addendum)
Pt to ED via POV, when asked why patient is here pt states " I don't know some demonic demons or something". Pt endorses marijuanna use today. Pt noted to be tremoring in triage.   Pt endorses hallucinations, states "spiritually yeah". Pt denies pain however states that spirits have been attacking him, denies sick contacts however states that he may have gone into a house with a seance happening.     Pt denies SI/HI at this time.

## 2022-01-30 NOTE — ED Notes (Signed)
1 pair green pants, 1 pair black pants, 1 pair black shoes, 1 pair blue boxers, 1 pair cream socks, 1 jean jacket, 1 phone, 1 pair ear buds, 1 blue shirt, 1 camo jacket, 1 bag of snacks, 1 black hat, 1 hair tie, 1 black scarf, 1 unidentified pill placed in urine cup, 1 black hair tie.   Pt dressed into burgundy scrubs by this RN, Urban Gibson, EDT, and [place], EDT.

## 2022-01-31 ENCOUNTER — Emergency Department
Admission: EM | Admit: 2022-01-31 | Discharge: 2022-01-31 | Disposition: A | Discharge status: Home / Self Care | Payer: PRIVATE HEALTH INSURANCE | Attending: Emergency Medicine | Admitting: Emergency Medicine

## 2022-01-31 DIAGNOSIS — F29 Unspecified psychosis not due to a substance or known physiological condition: Secondary | ICD-10-CM

## 2022-01-31 DIAGNOSIS — F1910 Other psychoactive substance abuse, uncomplicated: Secondary | ICD-10-CM

## 2022-01-31 LAB — URINE DRUG SCREEN, QUALITATIVE (ARMC ONLY)
Amphetamines, Ur Screen: NOT DETECTED
Barbiturates, Ur Screen: NOT DETECTED
Benzodiazepine, Ur Scrn: NOT DETECTED
Cannabinoid 50 Ng, Ur ~~LOC~~: POSITIVE — AB
Cocaine Metabolite,Ur ~~LOC~~: NOT DETECTED
MDMA (Ecstasy)Ur Screen: NOT DETECTED
Methadone Scn, Ur: NOT DETECTED
Opiate, Ur Screen: NOT DETECTED
Phencyclidine (PCP) Ur S: NOT DETECTED
Tricyclic, Ur Screen: NOT DETECTED

## 2022-01-31 MED ORDER — ACETAMINOPHEN 325 MG PO TABS
650.0000 mg | ORAL_TABLET | Freq: Once | ORAL | Status: AC
  Administered 2022-01-31: 650 mg via ORAL
  Filled 2022-01-31: qty 2

## 2022-01-31 NOTE — ED Notes (Addendum)
Pt discharging home. Discharge teaching done and pt verbalized understanding. Pt signed paper discharge form. Given all his personal belongings. Escorted to lobby. Stable, ambulatory and in NAD.

## 2022-01-31 NOTE — ED Notes (Signed)
Pt. To BHU from ED ambulatory without difficulty, to room  BHU 1. Pt. Is alert and oriented, warm and dry in no distress. Pt. Denies SI, HI, and AVH. Pt. Calm and cooperative. Pt. Made aware of security cameras and Q15 minute rounds. Patient was able to give a urine specimen.  Urine walked to lab by Probation officer. Pt. Encouraged to let Nursing staff know of any concerns or needs.

## 2022-01-31 NOTE — Progress Notes (Signed)
   01/31/22 1200  Spiritual Encounters  Type of Visit Initial  Care provided to: Patient  Referral source Chaplain assessment  Reason for visit Routine spiritual support  OnCall Visit No  Spiritual Framework  Presenting Themes Courage hope and growth  Interventions  Spiritual Care Interventions Made Reflective listening  Intervention Outcomes  Outcomes Reduced fear;Reduced anxiety  Spiritual Care Plan  Spiritual Care Issues Still Outstanding No further spiritual care needs at this time (see row info)

## 2022-01-31 NOTE — ED Provider Notes (Signed)
Emergency Medicine Observation Re-evaluation Note  DEO MEHRINGER is a 22 y.o. male, seen on rounds today.  Pt initially presented to the ED for complaints of Psychiatric Evaluation  Currently, the patient is is no acute distress. Denies any concerns at this time.  Physical Exam  Blood pressure (!) 163/97, pulse (!) 115, temperature 97.7 F (36.5 C), temperature source Oral, resp. rate 18, height 5\' 4"  (1.626 m), weight 72.6 kg, SpO2 96 %.  Physical Exam: General: No apparent distress Pulm: Normal WOB Neuro: Moving all extremities Psych: Resting comfortably     ED Course / MDM     I have reviewed the labs performed to date as well as medications administered while in observation.  Recent changes in the last 24 hours include: Patient was evaluated by psychiatry.  Concern for methamphetamine use and acute psychosis.  Waiting for psychiatry disposition.  Patient is here voluntarily.    Plan   Current plan: Patient awaiting psychiatric disposition. Patient is not under full IVC at this time.    Nathaniel Man, MD 01/31/22 [phone]

## 2022-01-31 NOTE — Consult Note (Signed)
[place] Psychiatry Consult   Reason for Consult:  Psychosis Referring Physician:  Dr. Starleen Blue Patient Identification: Tim Sparks MRN:  [id number] Principal Diagnosis: Substance abuse [facility]) Diagnosis:  Principal Problem:   Substance abuse ([place])   Total Time spent with patient: 45 minutes  Subjective: "I had some demons messing with me". Tim Sparks is a 22 y.o. male admitted with auditory hallucinations and paranoid delusions of being attacked by spirits likely secondary to substance use, with a positive urine drug screen for marijuana.    Upon evaluation, patient is clear and lucid, cooperative, and engages easily in conversation. The patient has experienced intermittent paranoia but denies current suicidal or homicidal ideation, as well as auditory or visual hallucinations. Patient reports the marijuana "may be making it worse" . Sleep and appetite are within normal limits. Patient reports he is homeless with no local family support. He reports multiple incarcerations, most recent for breaking and entering. Patient does not appear to be responding to internal stimuli.   HPI: Tim Sparks is a 22 y.o. male with a history of substance use presented to the ED with complaints of spirits attacking him, which began after hearing sounds in a house. Upon evaluation patient inappropriately smiling, however he was cooperative, speech was clear and coherent. He reports entering a house with spirits 2 days prior, feeling followed and spiritually attacked, and speaking in tongues.    Past Psychiatric History: History of methamphetamine use with prior ED visits, currently denies use except for marijuana, which was used today before presentation. Denies any prior mental health diagnoses or psychiatric hospitalizations.  Risk to Self:  No Risk to Others:  No  Prior Inpatient Therapy:  No  Prior Outpatient Therapy:  No   Past Medical History: History reviewed. No pertinent past medical  history. History reviewed. No pertinent surgical history. Family History: History reviewed. No pertinent family history. Family Psychiatric  History: History reviewed. No pertinent family history. Social History:  Social History   Substance and Sexual Activity  Alcohol Use No     Social History   Substance and Sexual Activity  Drug Use Yes   Types: Marijuana, Methamphetamines   Comment: xanax    Social History   Socioeconomic History   Marital status: Single    Spouse name: Not on file   Number of children: Not on file   Years of education: Not on file   Highest education level: Not on file  Occupational History   Not on file  Tobacco Use   Smoking status: Every Day    Packs/day: 1.00    Types: Cigarettes   Smokeless tobacco: Never  Substance and Sexual Activity   Alcohol use: No   Drug use: Yes    Types: Marijuana, Methamphetamines    Comment: xanax   Sexual activity: Not on file  Other Topics Concern   Not on file  Social History Narrative   Not on file   Social Determinants of Health   Financial Resource Strain: Not on file  Food Insecurity: Not on file  Transportation Needs: Not on file  Physical Activity: Not on file  Stress: Not on file  Social Connections: Not on file   Additional Social History:    Allergies:  No Known Allergies  Labs:  Results for orders placed or performed during the hospital encounter of 01/31/22 (from the past 48 hour(s))  Comprehensive metabolic panel     Status: Abnormal   Collection Time: 01/30/22  9:38 PM  Result  Value Ref Range   Sodium 138 135 - 145 mmol/L   Potassium 3.8 3.5 - 5.1 mmol/L    Comment: HEMOLYSIS AT THIS LEVEL MAY AFFECT RESULT   Chloride 102 98 - 111 mmol/L   CO2 25 22 - 32 mmol/L   Glucose, Bld 124 (H) 70 - 99 mg/dL    Comment: Glucose reference range applies only to samples taken after fasting for at least 8 hours.   BUN 21 (H) 6 - 20 mg/dL   Creatinine, Ser 1.06 0.61 - 1.24 mg/dL   Calcium 9.1  8.9 - 10.3 mg/dL   Total Protein 7.7 6.5 - 8.1 g/dL   Albumin 4.6 3.5 - 5.0 g/dL   AST 60 (H) 15 - 41 U/L   ALT 37 0 - 44 U/L   Alkaline Phosphatase 68 38 - 126 U/L   Total Bilirubin 1.0 0.3 - 1.2 mg/dL   GFR, Estimated >60 >60 mL/min    Comment: (NOTE) Calculated using the CKD-EPI Creatinine Equation (2021)    Anion gap 11 5 - 15    Comment: Performed at [facility], [address] [place], [place] [address]  Ethanol     Status: None   Collection Time: 01/30/22  9:38 PM  Result Value Ref Range   Alcohol, Ethyl (B) <10 <10 mg/dL    Comment: (NOTE) Lowest detectable limit for serum alcohol is 10 mg/dL.  For medical purposes only. Performed at [facility], [address] [place], [place] [address]   Salicylate level     Status: Abnormal   Collection Time: 01/30/22  9:38 PM  Result Value Ref Range   Salicylate Lvl <1.7 (L) 7.0 - 30.0 mg/dL    Comment: Performed at [facility], [address] [place], [place] [address]  Acetaminophen level     Status: Abnormal   Collection Time: 01/30/22  9:38 PM  Result Value Ref Range   Acetaminophen (Tylenol), Serum <10 (L) 10 - 30 ug/mL    Comment: (NOTE) Therapeutic concentrations vary significantly. A range of 10-30 ug/mL  may be an effective concentration for many patients. However, some  are best treated at concentrations outside of this range. Acetaminophen concentrations >150 ug/mL at 4 hours after ingestion  and >50 ug/mL at 12 hours after ingestion are often associated with  toxic reactions.  Performed at [facility], [address] [place], [place] [address]   cbc     Status: Abnormal   Collection Time: 01/30/22  9:38 PM  Result Value Ref Range   WBC 18.0 (H) 4.0 - 10.5 K/uL   RBC 5.05 4.22 - 5.81 MIL/uL   Hemoglobin 14.8 13.0 - 17.0 g/dL   HCT 42.0 39.0 - 52.0 %   MCV 83.2 80.0 - 100.0 fL   MCH 29.3 26.0 - 34.0 pg   MCHC 35.2 30.0 - 36.0 g/dL   RDW 12.4 11.5 - 15.5 %    Platelets 344 150 - 400 K/uL   nRBC 0.0 0.0 - 0.2 %    Comment: Performed at [facility], [address] [place],  [address]  Urine Drug Screen, Qualitative     Status: Abnormal   Collection Time: 01/30/22  9:38 PM  Result Value Ref Range   Tricyclic, Ur Screen NONE DETECTED NONE DETECTED   Amphetamines, Ur Screen NONE DETECTED NONE DETECTED   MDMA (Ecstasy)Ur Screen NONE DETECTED NONE DETECTED   Cocaine Metabolite,Ur [place] NONE DETECTED NONE DETECTED   Opiate, Ur Screen NONE DETECTED  NONE DETECTED   Phencyclidine (PCP) Ur S NONE DETECTED NONE DETECTED   Cannabinoid 50 Ng, Ur [place] POSITIVE (A) NONE DETECTED   Barbiturates, Ur Screen NONE DETECTED NONE DETECTED   Benzodiazepine, Ur Scrn NONE DETECTED NONE DETECTED   Methadone Scn, Ur NONE DETECTED NONE DETECTED    Comment: (NOTE) Tricyclics + metabolites, urine    Cutoff 1000 ng/mL Amphetamines + metabolites, urine  Cutoff 1000 ng/mL MDMA (Ecstasy), urine              Cutoff 500 ng/mL Cocaine Metabolite, urine          Cutoff 300 ng/mL Opiate + metabolites, urine        Cutoff 300 ng/mL Phencyclidine (PCP), urine         Cutoff 25 ng/mL Cannabinoid, urine                 Cutoff 50 ng/mL Barbiturates + metabolites, urine  Cutoff 200 ng/mL Benzodiazepine, urine              Cutoff 200 ng/mL Methadone, urine                   Cutoff 300 ng/mL  The urine drug screen provides only a preliminary, unconfirmed analytical test result and should not be used for non-medical purposes. Clinical consideration and professional judgment should be applied to any positive drug screen result due to possible interfering substances. A more specific alternate chemical method must be used in order to obtain a confirmed analytical result. Gas chromatography / mass spectrometry (GC/MS) is the preferred confirm atory method. Performed at [facility], [address] [place], [place] [address]   Resp panel by RT-PCR (RSV, Flu A&B,  Covid) Anterior Nasal Swab     Status: None   Collection Time: 01/30/22  9:38 PM   Specimen: Anterior Nasal Swab  Result Value Ref Range   SARS Coronavirus 2 by RT PCR NEGATIVE NEGATIVE    Comment: (NOTE) SARS-CoV-2 target nucleic acids are NOT DETECTED.  The SARS-CoV-2 RNA is generally detectable in upper respiratory specimens during the acute phase of infection. The lowest concentration of SARS-CoV-2 viral copies this assay can detect is 138 copies/mL. A negative result does not preclude SARS-Cov-2 infection and should not be used as the sole basis for treatment or other patient management decisions. A negative result may occur with  improper specimen collection/handling, submission of specimen other than nasopharyngeal swab, presence of viral mutation(s) within the areas targeted by this assay, and inadequate number of viral copies(<138 copies/mL). A negative result must be combined with clinical observations, patient history, and epidemiological information. The expected result is Negative.  Fact Sheet for Patients:  EntrepreneurPulse.com.au  Fact Sheet for Healthcare Providers:  IncredibleEmployment.be  This test is no t yet approved or cleared by the [place] FDA and  has been authorized for detection and/or diagnosis of SARS-CoV-2 by FDA under an Emergency Use Authorization (EUA). This EUA will remain  in effect (meaning this test can be used) for the duration of the COVID-19 declaration under Section 564(b)(1) of the Act, 21 U.S.C.section 360bbb-3(b)(1), unless the authorization is terminated  or revoked sooner.       Influenza A by PCR NEGATIVE NEGATIVE   Influenza B by PCR NEGATIVE NEGATIVE    Comment: (NOTE) The Xpert Xpress SARS-CoV-2/FLU/RSV plus assay is intended as an aid in the diagnosis of influenza from Nasopharyngeal swab specimens and should not be used as a sole basis for treatment. Nasal  washings and aspirates  are unacceptable for Xpert Xpress SARS-CoV-2/FLU/RSV testing.  Fact Sheet for Patients: BloggerCourse.com  Fact Sheet for Healthcare Providers: SeriousBroker.it  This test is not yet approved or cleared by the [place] FDA and has been authorized for detection and/or diagnosis of SARS-CoV-2 by FDA under an Emergency Use Authorization (EUA). This EUA will remain in effect (meaning this test can be used) for the duration of the COVID-19 declaration under Section 564(b)(1) of the Act, 21 U.S.C. section 360bbb-3(b)(1), unless the authorization is terminated or revoked.     Resp Syncytial Virus by PCR NEGATIVE NEGATIVE    Comment: (NOTE) Fact Sheet for Patients: BloggerCourse.com  Fact Sheet for Healthcare Providers: SeriousBroker.it  This test is not yet approved or cleared by the [place] FDA and has been authorized for detection and/or diagnosis of SARS-CoV-2 by FDA under an Emergency Use Authorization (EUA). This EUA will remain in effect (meaning this test can be used) for the duration of the COVID-19 declaration under Section 564(b)(1) of the Act, 21 U.S.C. section 360bbb-3(b)(1), unless the authorization is terminated or revoked.  Performed at [facility], [address] [place], [place] [address]     No current facility-administered medications for this encounter.   Current Outpatient Medications  Medication Sig Dispense Refill   oxyCODONE (OXY IR/ROXICODONE) 5 MG immediate release tablet Take 1 tablet (5 mg total) by mouth every 4 (four) hours as needed for moderate pain. (Patient not taking: Reported on 06/24/2021) 15 tablet 0    Musculoskeletal: Strength & Muscle Tone: within normal limits Gait & Station: normal Patient leans: N/A   Psychiatric Specialty Exam:  Presentation  General Appearance: Appropriate for Environment  Eye Contact:  Good  Speech:Clear and Coherent  Speech Volume:Normal  Handedness:Right   Mood and Affect  Mood:-- (Patient states, "I'm rested up".)  Affect:Congruent   Thought Process  Thought Processes:Coherent  Descriptions of Associations:Intact  Orientation:Full (Time, Place and Person)  Thought Content:Logical  History of Schizophrenia/Schizoaffective disorder:No  Duration of Psychotic Symptoms: Less than six months  Hallucinations:Hallucinations: None (Denies on evaluation today)  Ideas of Reference:None  Suicidal Thoughts:Suicidal Thoughts: No  Homicidal Thoughts:Homicidal Thoughts: No   Sensorium  Memory:Immediate Good; Remote Good; Recent Good  Judgment: Good  Insight: Good  Executive Functions  Concentration:Good  Attention Span:Good  Recall:Good  Fund of Knowledge:Good  Language:Good   Psychomotor Activity  Psychomotor Activity:Psychomotor Activity: Normal   Assets  Assets:Desire for Improvement; Resilience; Communication Skills   Sleep  Sleep:Sleep: Good   Physical Exam: Physical Exam HENT:     Head: Normocephalic.  Pulmonary:     Effort: Pulmonary effort is normal.  Musculoskeletal:        General: Normal range of motion.     Cervical back: Normal range of motion.  Skin:    General: Skin is dry.  Neurological:     Mental Status: He is alert and oriented to person, place, and time.    Review of Systems  Respiratory:  Negative for shortness of breath.   Musculoskeletal: Negative.   Psychiatric/Behavioral:  Positive for substance abuse. Negative for hallucinations and suicidal ideas.    Blood pressure 133/70, pulse 89, temperature 97.8 F (36.6 C), temperature source Oral, resp. rate 17, height 5\' 4"  (1.626 m), weight 72.6 kg, SpO2 99 %. Body mass index is 27.46 kg/m.  Treatment Plan Summary: Plan This patient does not meet criteria for psychiatric inpatient hospitalization.  Will provide patient with contact information to [facility]  for support services  post discharge.    Disposition: No evidence of imminent risk to self or others at present.   Patient does not meet criteria for psychiatric inpatient admission. Supportive therapy provided about ongoing stressors. Discussed crisis plan, support from social network, calling 911, coming to the Emergency Department, and calling Suicide Hotline.  Ronny Flurry, NP 01/31/2022 3:30 PM

## 2022-01-31 NOTE — ED Notes (Signed)
Vol pending consult 

## 2022-01-31 NOTE — ED Provider Notes (Signed)
The patient has been evaluated at bedside by  psychiatry.  Patient is clinically stable.  Not felt to be a danger to self or others.  No SI or Hi.  No indication for inpatient psychiatric admission at this time.  Appropriate for continued outpatient therapy.    Merlyn Lot, MD 01/31/22 1537

## 2022-01-31 NOTE — ED Provider Notes (Signed)
[facility] Provider Note    Event Date/Time   First MD Initiated Contact with Patient 01/31/22 [phone]     (approximate)   History   Psychiatric Evaluation   HPI  Tim Sparks is a 22 y.o. male past medical history of substance use who presents for unclear reasons.  Patient tells me he would rather not say he brought him here tonight.  Tells me he is here because he has spirits and him.  Says that spirits have been attacking him and that this started ever since he had a sounds in his house.  In triage apparently he is also talking about demons.  Patient denies any drug or alcohol use to me.  I do see he has had visits to the ED before for methamphetamine use but he specifically denies this other than using marijuana.  He denies alcohol use.  Denies SI or HI or any prior history of mental health diagnoses.     History reviewed. No pertinent past medical history.  Patient Active Problem List   Diagnosis Date Noted   Pneumothorax 03/08/2016     Physical Exam  Triage Vital Signs: ED Triage Vitals  Enc Vitals Group     BP 01/30/22 2128 (!) 163/97     Pulse Rate 01/30/22 2128 (!) 115     Resp 01/30/22 2128 18     Temp 01/30/22 2128 97.7 F (36.5 C)     Temp Source 01/30/22 2128 Oral     SpO2 01/30/22 2128 96 %     Weight 01/30/22 2135 160 lb (72.6 kg)     Height 01/30/22 2135 5\' 4"  (1.626 m)     Head Circumference --      Peak Flow --      Pain Score 01/30/22 2135 0     Pain Loc --      Pain Edu? --      Excl. in [place]? --     Most recent vital signs: Vitals:   01/30/22 2128  BP: (!) 163/97  Pulse: (!) 115  Resp: 18  Temp: 97.7 F (36.5 C)  SpO2: 96%     General: Awake, no distress.  CV:  Good peripheral perfusion.  Resp:  Normal effort.  Abd:  No distention.  Neuro:             Awake, Alert, Oriented x 3  Other:  Patient is calm and cooperative, delusional   ED Results / Procedures / Treatments  Labs (all labs ordered are  listed, but only abnormal results are displayed) Labs Reviewed  COMPREHENSIVE METABOLIC PANEL - Abnormal; Notable for the following components:      Result Value   Glucose, Bld 124 (*)    BUN 21 (*)    AST 60 (*)    All other components within normal limits  SALICYLATE LEVEL - Abnormal; Notable for the following components:   Salicylate Lvl <9.0 (*)    All other components within normal limits  ACETAMINOPHEN LEVEL - Abnormal; Notable for the following components:   Acetaminophen (Tylenol), Serum <10 (*)    All other components within normal limits  CBC - Abnormal; Notable for the following components:   WBC 18.0 (*)    All other components within normal limits  RESP PANEL BY RT-PCR (RSV, FLU A&B, COVID)  [id number]  ETHANOL  URINE DRUG SCREEN, QUALITATIVE ([facility] ONLY)     EKG     RADIOLOGY    PROCEDURES:  Critical Care  performed: No  Procedures   MEDICATIONS ORDERED IN ED: Medications - No data to display   IMPRESSION / MDM / [place] / ED COURSE  I reviewed the triage vital signs and the nursing notes.                              Patient's presentation is most consistent with acute complicated illness / injury requiring diagnostic workup.  Differential diagnosis includes, but is not limited to, substance-induced psychosis, psychosis secondary to schizophrenia or bipolar disorder, less likely meningitis/encephalitis  The patient is a 22 year old male presents for somewhat unclear reasons to the ED.  Tells me he has spirits inside of him ever since he had a stance at his house.  He denies SI or HI denies overt hallucinations denies drug use.  We will not tell me who brought him here tonight.  He is hypertensive and tachycardic.  On exam he is calm and alert and oriented does not appear to be responding to internal stimuli.  Labs notable for leukocytosis but patient has no infectious symptoms suspect reactive.  UDS is still pending question whether this is  drug-induced as he does have history of amphetamine use.  Will keep him in the [place] overnight and consult psychiatry.  Patient is here voluntarily so will not IVC at this point. The patient has been placed in psychiatric observation due to the need to provide a safe environment for the patient while obtaining psychiatric consultation and evaluation, as well as ongoing medical and medication management to treat the patient's condition.  The patient has not been placed under full IVC at this time.         FINAL CLINICAL IMPRESSION(S) / ED DIAGNOSES   Final diagnoses:  Psychosis, unspecified psychosis type ([place])     Rx / DC Orders   ED Discharge Orders     None        Note:  This document was prepared using Dragon voice recognition software and may include unintentional dictation errors.   Rada Hay, MD 01/31/22 [phone]

## 2022-01-31 NOTE — ED Notes (Signed)

## 2022-01-31 NOTE — BH Assessment (Signed)
Comprehensive Clinical Assessment (CCA) Note  01/31/2022 Tim Sparks [id number] Recommendations for Services/Supports/Treatments: Consulted with Tim Sparks, D., NP, who determined pt. meets inpatient criteria. Notified Dr. Starleen Sparks and Tim Mercury, RN of disposition recommendation.   Tim Sparks is a 22 year old, English speaking, Caucasian male with a hx of meth abuse and no known psych history. Pt presented to [facility] ED voluntarily due to the pt's belief that he is being attacked by demons. Upon assessment, the pt. continued to endorse paranoid delusions and abnormal mentation. Pt continues to endorse delusions about feeling possessed and spiritually attacked. Pt explained that he began having "spiritual things going on after going into a house with spirits." Pt denied having depression or a low mood. Pt endorsed anxiety symptoms and reluctantly admitted to feeling paranoid. The pt. was candid about his cannabis use within the last 24 hours, admitting that he uses it whenever he can get his hands on it. Pt's UDS is positive for cannabis. Pt denied meth use. Pt does not have psych services. The pt reported that he is homeless and unemployed. Pt explained that he was released from jail 2-3 days ago due to having his breaking and entering charges dropped. The pt. reported having sleep disturbance. The pt. had poor insight and impaired reality testing. Pt appeared to be responding to internal stimuli and had blocked speech. Pt was distractible with poor concentration. Pt had fleeting eye contact and was guarded during the assessment. Pt presented with an anxious mood; affect was incongruent /euthymic. Pt had a silly attitude towards this examiner. Pt denied current SI/HI. Pt reluctantly endorsed hallucinating stating, "It's hard to explain" when asked if he hears voices. Pt also eluded to seeing shadows that cause him distress.  Chief Complaint:  Chief Complaint  Patient presents with   Psychiatric Evaluation    Visit Diagnosis: Acute psychosis    CCA Screening, Triage and Referral (STR)  Patient Reported Information How did you hear about [place]? Self  Referral name: No data recorded Referral phone number: No data recorded  Whom do you see for routine medical problems? No data recorded Practice/Facility Name: No data recorded Practice/Facility Phone Number: No data recorded Name of Contact: No data recorded Contact Number: No data recorded Contact Fax Number: No data recorded Prescriber Name: No data recorded Prescriber Address (if known): No data recorded  What Is the Reason for Your Visit/Call Today? Pt to ED via POV, when asked why patient is here pt states " I don't know some demonic demons or something". Pt endorses marijuanna use today. Pt noted to be tremoring in triage.      Pt endorses hallucinations, states "spiritually yeah". Pt denies pain however states that spirits have been attacking him, denies sick contacts however states that he may have gone into a house with a seance happening.  How Long Has This Been Causing You Problems? <Week  What Do You Feel Would Help You the Most Today? Stress Management   Have You Recently Been in Any Inpatient Treatment (Hospital/Detox/Crisis Center/28-Day Program)? No data recorded Name/Location of Program/Hospital:No data recorded How Long Were You There? No data recorded When Were You Discharged? No data recorded  Have You Ever Received Services From [facility] Before? No data recorded Who Do You See at [facility]? No data recorded  Have You Recently Had Any Thoughts About Hurting Yourself? No  Are You Planning to Commit Suicide/Harm Yourself At This time? No   Have you Recently Had Thoughts About [place]? No  Explanation: No data recorded  Have You Used Any Alcohol or Drugs in the Past 24 Hours? Yes  How Long Ago Did You Use Drugs or Alcohol? No data recorded What Did You Use and How Much? Cannabis   Do You  Currently Have a Therapist/Psychiatrist? No  Name of Therapist/Psychiatrist: n/a   Have You Been Recently Discharged From Any Office Practice or Programs? Yes  Explanation of Discharge From Practice/Program: Pt recently discharged from jail.     CCA Screening Triage Referral Assessment Type of Contact: Face-to-Face  Is this Initial or Reassessment? No data recorded Date Telepsych consult ordered in [facility]:  No data recorded Time Telepsych consult ordered in [facility]:  No data recorded  Patient Reported Information Reviewed? No data recorded Patient Left Without Being Seen? No data recorded Reason for Not Completing Assessment: No data recorded  Collateral Involvement: None provided   Does Patient Have a [place]? No data recorded Name and Contact of Legal Guardian: No data recorded If Minor and Not Living with Parent(s), Who has Custody? -- (n/a)  Is CPS involved or ever been involved? Never  Is APS involved or ever been involved? Never   Patient Determined To Be At Risk for Harm To Self or Others Based on Review of Patient Reported Information or Presenting Complaint? No  Method: No Plan  Availability of Means: No access or NA  Intent: Vague intent or NA  Notification Required: No need or identified person  Additional Information for Danger to Others Potential: Active psychosis  Additional Comments for Danger to Others Potential: -- (n/a)  Are There Guns or Other Weapons in Your Home? -- (n/a)  Types of Guns/Weapons: -- (n/a)  Are These Weapons Safely Secured?                            -- (n/a)  Who Could Verify You Are Able To Have These Secured: -- (n/a)  Do You Have any Outstanding Charges, Pending Court Dates, Parole/Probation? No data recorded Contacted To Inform of Risk of Harm To Self or Others: -- (n/a)   Location of Assessment: [facility] ED   Does Patient Present under Involuntary Commitment? No  IVC Papers Initial File Date: No  data recorded  [place] of Residence: [place]   Patient Currently Receiving the Following Services: Not Receiving Services   Determination of Need: Emergent (2 hours)   Options For Referral: Inpatient Hospitalization     CCA Biopsychosocial Intake/Chief Complaint:  No data recorded Current Symptoms/Problems: No data recorded  Patient Reported Schizophrenia/Schizoaffective Diagnosis in Past: No   Strengths: Pt is physically healthy, pt is able to communicate his needs  Preferences: No data recorded Abilities: No data recorded  Type of Services Patient Feels are Needed: No data recorded  Initial Clinical Notes/Concerns: No data recorded  Mental Health Symptoms Depression:   None   Duration of Depressive symptoms:  Greater than two weeks   Mania:   None   Anxiety:    Tension; Worrying   Psychosis:   Delusions; Hallucinations   Duration of Psychotic symptoms:  Less than six months   Trauma:   N/A   Obsessions:   Poor insight; Recurrent & persistent thoughts/impulses/images; Disrupts routine/functioning; Intrusive/time consuming; Cause anxiety   Compulsions:   Repeated behaviors/mental acts; Intrusive/time consuming; Poor Insight   Inattention:   None   Hyperactivity/Impulsivity:   None   Oppositional/Defiant Behaviors:   None   Emotional Irregularity:  None   Other Mood/Personality Symptoms:   n/a    Mental Status Exam Appearance and self-care  Stature:   Average   Weight:   Average weight   Clothing:   Casual   Grooming:   Neglected   Cosmetic use:   None   Posture/gait:   Normal   Motor activity:   Not Remarkable   Sensorium  Attention:   Distractible   Concentration:   Preoccupied   Orientation:   X5   Recall/memory:   Normal   Affect and Mood  Affect:   Appropriate   Mood:   Anxious   Relating  Eye contact:   Normal   Facial expression:   Responsive   Attitude toward examiner:   Silly;  Guarded   Thought and Language  Speech flow:  Blocked   Thought content:   Delusions   Preoccupation:   None   Hallucinations:   Auditory; Visual   Organization:  No data recorded  [employer] of Knowledge:   Fair   Intelligence:   Average   Abstraction:   Normal   Judgement:   Impaired   Reality Testing:   Distorted   Insight:   None/zero insight   Decision Making:   Normal   Social Functioning  Social Maturity:   Responsible   Social Judgement:   Normal   Stress  Stressors:   Surveyor, quantity; Housing   Coping Ability:   Overwhelmed   Skill Deficits:   None   Supports:   Support needed     Religion: Religion/Spirituality Are You A Religious Person?:  (Pt stated, "I don't want to talk about it".) How Might This Affect Treatment?: n/a  Leisure/Recreation: Leisure / Recreation Do You Have Hobbies?: No  Exercise/Diet: Exercise/Diet Do You Exercise?: No Have You Gained or Lost A Significant Amount of Weight in the Past Six Months?: No Do You Follow a Special Diet?: No Do You Have Any Trouble Sleeping?: Yes Explanation of Sleeping Difficulties: Pt stated, "I slept yesterday" and started laughing.   CCA Employment/Education Employment/Work Situation: Employment / Work Situation Employment Situation: Unemployed Patient's Job has Been Impacted by Current Illness: No Has Patient ever Been in Equities trader?: No  Education: Education Is Patient Currently Attending School?: No Last Grade Completed:  (UTA) Did You Attend College?:  (UTA) Did You Have An Individualized Education Program (IIEP):  (UTA) Did You Have Any Difficulty At School?: No Patient's Education Has Been Impacted by Current Illness: No   CCA Family/Childhood History Family and Relationship History: Family history Marital status: Single Does patient have children?: No  Childhood History:  Childhood History By whom was/is the patient raised?:  (UTA) Did  patient suffer any verbal/emotional/physical/sexual abuse as a child?: No Did patient suffer from severe childhood neglect?: No Has patient ever been sexually abused/assaulted/raped as an adolescent or adult?: No Witnessed domestic violence?: No Has patient been affected by domestic violence as an adult?: No  Child/Adolescent Assessment:     CCA Substance Use Alcohol/Drug Use: Alcohol / Drug Use Pain Medications: See MAR Prescriptions: See MAR Over the Counter: See MAR History of alcohol / drug use?: Yes Longest period of sobriety (when/how long): Unknown Negative Consequences of Use: Personal relationships, Legal Withdrawal Symptoms: None                         ASAM's:  Six Dimensions of Multidimensional Assessment  Dimension 1:  Acute Intoxication and/or Withdrawal Potential:  Dimension 1:  Description of individual's past and current experiences of substance use and withdrawal: Pt has a hx of meth use. Pt's UDS + for cannabis.  Dimension 2:  Biomedical Conditions and Complications:      Dimension 3:  Emotional, Behavioral, or Cognitive Conditions and Complications:  Dimension 3:  Description of emotional, behavioral, or cognitive conditions and complications: Pt is actively psychotic  Dimension 4:  Readiness to Change:     Dimension 5:  Relapse, Continued use, or Continued Problem Potential:     Dimension 6:  Recovery/Living Environment:  Dimension 6:  Recovery/Iiving environment criteria description: Pt is currently homeless  ASAM Severity Score: ASAM's Severity Rating Score: 12  ASAM Recommended Level of Treatment: ASAM Recommended Level of Treatment: Level III Residential Treatment   Substance use Disorder (SUD) Substance Use Disorder (SUD)  Checklist Symptoms of Substance Use: Continued use despite having a persistent/recurrent physical/psychological problem caused/exacerbated by use, Evidence of tolerance, Presence of craving or strong urge to use, Social,  occupational, recreational activities given up or reduced due to use, Continued use despite persistent or recurrent social, interpersonal problems, caused or exacerbated by use, Persistent desire or unsuccessful efforts to cut down or control use, Recurrent use that results in a failure to fulfill major role obligations (work, school, home)  Recommendations for Services/Supports/Treatments: Recommendations for Services/Supports/Treatments Recommendations For Services/Supports/Treatments: Individual Therapy  DSM5 Diagnoses: Patient Active Problem List   Diagnosis Date Noted   Pneumothorax 03/08/2016   @[email or web address] @  [address] [place], LCAS

## 2022-02-02 ENCOUNTER — Other Ambulatory Visit: Payer: Self-pay

## 2022-02-02 ENCOUNTER — Emergency Department
Admission: EM | Admit: 2022-02-02 | Discharge: 2022-02-02 | Disposition: A | Discharge status: Home / Self Care | Payer: Self-pay | Attending: Emergency Medicine | Admitting: Emergency Medicine

## 2022-02-02 DIAGNOSIS — R1030 Lower abdominal pain, unspecified: Secondary | ICD-10-CM

## 2022-02-02 DIAGNOSIS — R1084 Generalized abdominal pain: Secondary | ICD-10-CM | POA: Insufficient documentation

## 2022-02-02 LAB — LIPASE, BLOOD: Lipase: 38 U/L (ref 11–51)

## 2022-02-02 LAB — COMPREHENSIVE METABOLIC PANEL
ALT: 26 U/L (ref 0–44)
AST: 24 U/L (ref 15–41)
Albumin: 4.2 g/dL (ref 3.5–5.0)
Alkaline Phosphatase: 62 U/L (ref 38–126)
Anion gap: 12 (ref 5–15)
BUN: 18 mg/dL (ref 6–20)
CO2: 23 mmol/L (ref 22–32)
Calcium: 9.1 mg/dL (ref 8.9–10.3)
Chloride: 103 mmol/L (ref 98–111)
Creatinine, Ser: 0.65 mg/dL (ref 0.61–1.24)
GFR, Estimated: >60 mL/min (ref >60)
Glucose, Bld: 133 mg/dL — ABNORMAL HIGH (ref 70–99)
Potassium: 3.4 mmol/L — ABNORMAL LOW (ref 3.5–5.1)
Sodium: 138 mmol/L (ref 135–145)
Total Bilirubin: 0.6 mg/dL (ref 0.3–1.2)
Total Protein: 7 g/dL (ref 6.5–8.1)

## 2022-02-02 LAB — CBC
HCT: 41.1 % (ref 39.0–52.0)
Hemoglobin: 14.4 g/dL (ref 13.0–17.0)
MCH: 29.4 pg (ref 26.0–34.0)
MCHC: 35 g/dL (ref 30.0–36.0)
MCV: 83.9 fL (ref 80.0–100.0)
Platelets: 341 10*3/uL (ref 150–400)
RBC: 4.9 MIL/uL (ref 4.22–5.81)
RDW: 12.5 % (ref 11.5–15.5)
WBC: 11.9 10*3/uL — ABNORMAL HIGH (ref 4.0–10.5)
nRBC: 0 % (ref 0.0–0.2)

## 2022-02-02 NOTE — ED Notes (Signed)
Pt refusing blood work until he sees a doctor due to him being seen recently at this facility.

## 2022-02-02 NOTE — ED Triage Notes (Signed)
Pt here with abd pain x3 days after getting out of jail. Pt endorses generalized abd pain. Pt denies N/V/D. Pt tachycardic in triage.

## 2022-02-02 NOTE — Discharge Instructions (Addendum)
Your lab tests today were all okay.  Continue to eat and drink normally and monitor your symptoms at home.

## 2022-02-02 NOTE — ED Provider Notes (Signed)
[facility] Provider Note    Event Date/Time   First MD Initiated Contact with Patient 02/02/22 1058     (approximate)   History   Chief Complaint: Abdominal Pain   HPI  Tim Sparks is a 22 y.o. male with a history of substance abuse who comes ED complaining of lower abdominal pain for the past 3 days, which started after he was released from jail 3 days ago.  No aggravating or alleviating factors.  No nausea vomiting diarrhea or fever.  Reports has been eating and drinking normally.  He reports being worried that he might of eaten bad food that was cooked by other people while in jail.  Denies any new medications or drug withdrawal syndrome.  Denies ever using IV drugs.  Reports he only uses marijuana on occasion, no other drug use.  He was seen in the ED and had a behavioral medicine assessment 2 days ago, was cleared for discharge home at that time.  His auditory hallucinations from that time have improved.  He was also exhibiting some paranoia at that time     Physical Exam   Triage Vital Signs: ED Triage Vitals  Enc Vitals Group     BP 02/02/22 0905 (!) 147/101     Pulse Rate 02/02/22 0905 (!) 130     Resp 02/02/22 0905 20     Temp 02/02/22 0905 98.1 F (36.7 C)     Temp Source 02/02/22 0905 Oral     SpO2 02/02/22 0905 100 %     Weight 02/02/22 0906 160 lb 0.9 oz (72.6 kg)     Height 02/02/22 0906 5\' 4"  (1.626 m)     Head Circumference --      Peak Flow --      Pain Score 02/02/22 0906 4     Pain Loc --      Pain Edu? --      Excl. in [place]? --     Most recent vital signs: Vitals:   02/02/22 0905  BP: (!) 147/101  Pulse: (!) 130  Resp: 20  Temp: 98.1 F (36.7 C)  SpO2: 100%    General: Awake, no distress.  CV:  Good peripheral perfusion.  Tachycardia, heart rate 100.  Symmetric distal pulses. Resp:  Normal effort.  Regular rate and rhythm Abd:  No distention.  Soft with mild generalized lower abdominal tenderness.  No focal  tenderness of McBurney's point. Other:  Moist oral mucosa.   ED Results / Procedures / Treatments   Labs (all labs ordered are listed, but only abnormal results are displayed) Labs Reviewed  CBC - Abnormal; Notable for the following components:      Result Value   WBC 11.9 (*)    All other components within normal limits  LIPASE, BLOOD  COMPREHENSIVE METABOLIC PANEL  URINALYSIS, ROUTINE W REFLEX MICROSCOPIC     EKG Interpreted by me Sinus tachycardia rate 124.  Normal axis, normal intervals.  Normal QRS ST segments and T waves.   RADIOLOGY    PROCEDURES:  Procedures   MEDICATIONS ORDERED IN ED: Medications - No data to display   IMPRESSION / MDM / [place] / ED COURSE  I reviewed the triage vital signs and the nursing notes.                              Differential diagnosis includes, but is not limited to, paranoia,  drug abuse, viral illness, anemia, dehydration  Patient's presentation is most consistent with acute presentation with potential threat to life or bodily function.  Patient comes ED complaining of lower abdominal pain, worried that he may have ingested contaminated food.  The source of his concerns seems to be related to his paranoia that he has demonstrated in the past.  No SI HI or hallucinations.  Does not appear to be intoxicated, he is clinically sober.  Vital signs are unremarkable except for tachycardia which is much better on my exam that at triage.  Labs are reassuring.  Offered IV fluids for hydration which patient refuses.  With reassuring labs, patient requests discharge, stable for outpatient management.       FINAL CLINICAL IMPRESSION(S) / ED DIAGNOSES   Final diagnoses:  Lower abdominal pain     Rx / DC Orders   ED Discharge Orders     None        Note:  This document was prepared using Dragon voice recognition software and may include unintentional dictation errors.   Carrie Mew, MD 02/02/22  1309
